# Patient Record
Sex: Male | Born: 1955 | Race: Asian | Hispanic: No | Marital: Married | Smoking: Former smoker
Health system: Southern US, Community
[De-identification: ages and names within clinical notes are randomized; demographics above are authoritative.]

## PROBLEM LIST (undated history)

## (undated) DIAGNOSIS — R200 Anesthesia of skin: Secondary | ICD-10-CM

## (undated) DIAGNOSIS — I7090 Unspecified atherosclerosis: Secondary | ICD-10-CM

## (undated) DIAGNOSIS — M810 Age-related osteoporosis without current pathological fracture: Secondary | ICD-10-CM

## (undated) HISTORY — DX: Unspecified atherosclerosis: I70.90

## (undated) HISTORY — DX: Anesthesia of skin: R20.0

## (undated) HISTORY — DX: Age-related osteoporosis without current pathological fracture: M81.0

---

## 2019-02-20 DIAGNOSIS — I639 Cerebral infarction, unspecified: Secondary | ICD-10-CM

## 2019-02-20 HISTORY — DX: Cerebral infarction, unspecified: I63.9

## 2019-06-04 ENCOUNTER — Ambulatory Visit (INDEPENDENT_AMBULATORY_CARE_PROVIDER_SITE_OTHER): Payer: Self-pay | Admitting: Family Medicine

## 2019-06-04 ENCOUNTER — Encounter: Payer: Self-pay | Admitting: Family Medicine

## 2019-06-04 ENCOUNTER — Other Ambulatory Visit: Payer: Self-pay

## 2019-06-04 VITALS — BP 102/82 | HR 72 | Temp 97.3°F | Ht 63.0 in | Wt 146.0 lb

## 2019-06-04 DIAGNOSIS — Z598 Other problems related to housing and economic circumstances: Secondary | ICD-10-CM

## 2019-06-04 DIAGNOSIS — E663 Overweight: Secondary | ICD-10-CM | POA: Insufficient documentation

## 2019-06-04 DIAGNOSIS — E7800 Pure hypercholesterolemia, unspecified: Secondary | ICD-10-CM

## 2019-06-04 DIAGNOSIS — R200 Anesthesia of skin: Secondary | ICD-10-CM

## 2019-06-04 DIAGNOSIS — Z8673 Personal history of transient ischemic attack (TIA), and cerebral infarction without residual deficits: Secondary | ICD-10-CM

## 2019-06-04 DIAGNOSIS — Z7689 Persons encountering health services in other specified circumstances: Secondary | ICD-10-CM

## 2019-06-04 DIAGNOSIS — Z599 Problem related to housing and economic circumstances, unspecified: Secondary | ICD-10-CM

## 2019-06-04 LAB — CBC WITH DIFFERENTIAL/PLATELET
Basophils Absolute: 0 10*3/uL (ref 0.0–0.1)
Basophils Relative: 0.7 % (ref 0.0–3.0)
Eosinophils Absolute: 0.4 10*3/uL (ref 0.0–0.7)
Eosinophils Relative: 7.1 % — ABNORMAL HIGH (ref 0.0–5.0)
HCT: 44 % (ref 39.0–52.0)
Hemoglobin: 14.6 g/dL (ref 13.0–17.0)
Lymphocytes Relative: 38 % (ref 12.0–46.0)
Lymphs Abs: 2.4 10*3/uL (ref 0.7–4.0)
MCHC: 33.2 g/dL (ref 30.0–36.0)
MCV: 82.8 fl (ref 78.0–100.0)
Monocytes Absolute: 0.7 10*3/uL (ref 0.1–1.0)
Monocytes Relative: 10.7 % (ref 3.0–12.0)
Neutro Abs: 2.7 10*3/uL (ref 1.4–7.7)
Neutrophils Relative %: 43.5 % (ref 43.0–77.0)
Platelets: 301 10*3/uL (ref 150.0–400.0)
RBC: 5.32 Mil/uL (ref 4.22–5.81)
RDW: 13.9 % (ref 11.5–15.5)
WBC: 6.2 10*3/uL (ref 4.0–10.5)

## 2019-06-04 LAB — COMPREHENSIVE METABOLIC PANEL
ALT: 30 U/L (ref 0–53)
AST: 24 U/L (ref 0–37)
Albumin: 4.7 g/dL (ref 3.5–5.2)
Alkaline Phosphatase: 67 U/L (ref 39–117)
BUN: 13 mg/dL (ref 6–23)
CO2: 31 mEq/L (ref 19–32)
Calcium: 9.8 mg/dL (ref 8.4–10.5)
Chloride: 101 mEq/L (ref 96–112)
Creatinine, Ser: 0.75 mg/dL (ref 0.40–1.50)
GFR: 108.6 mL/min (ref >60.00)
Glucose, Bld: 91 mg/dL (ref 70–99)
Potassium: 4.1 mEq/L (ref 3.5–5.1)
Sodium: 137 mEq/L (ref 135–145)
Total Bilirubin: 0.7 mg/dL (ref 0.2–1.2)
Total Protein: 7.8 g/dL (ref 6.0–8.3)

## 2019-06-04 LAB — LIPID PANEL
Cholesterol: 219 mg/dL — ABNORMAL HIGH (ref 0–200)
HDL: 41.5 mg/dL (ref >39.00)
LDL Cholesterol: 152 mg/dL — ABNORMAL HIGH (ref 0–99)
NonHDL: 177.77
Total CHOL/HDL Ratio: 5
Triglycerides: 127 mg/dL (ref 0.0–149.0)
VLDL: 25.4 mg/dL (ref 0.0–40.0)

## 2019-06-04 MED ORDER — ATORVASTATIN CALCIUM 20 MG PO TABS: 20.0000 mg | ORAL_TABLET | Freq: Every day | ORAL | 3 refills | Status: DC

## 2019-06-04 NOTE — Addendum Note (Signed)
Addended by: Olean Ree B on: 06/04/2019 04:48 PM   Modules accepted: Orders

## 2019-06-04 NOTE — Progress Notes (Signed)
Subjective:    Patient ID: George Buckley, male    DOB: 08/04/1965, 64 y.o.   MRN: [id number]  HPI This is a 64 yo male who presents today to establish care. His daughter is with him and helping with translation. He moved from the [place] to the [place] last month along with his wife. He is living with his daughter and SIL. 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   Last CPE- had annual medical care PSA- unknown Colonoscopy- never Tdap- likely had when arrived to [place] Flu- unsure which vaccines he had when he came to the [place] Dental- not regular Exercise-walking on treadmill 30 minutes a day and riding stationary bike.   CVA- 02/20/19, brings CT reports with him today. Continues to have numbness all over (from neck down), worse on right side of body. . Was started on Lyrica, plavix. Does not think it has helped. 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 He had PT in the [place]. Initially needed help with ADLs, now able to do everything except button his shirt. No recent falls. Had high blood pressure prior to stroke. Not on any medication for blood pressure. He was intoxicated and fell, ? Cause of stroke. Was drinking several days a week prior to stroke according to the patient. Per his daughter, he was drinking daily and often getting intoxicated. He has not had any alcohol since the stroke.  CT scan results from [place] [facility] [place] [facility] 02/20/2019. Impression: No acute intracranial hemorrhage or major vascular territory infarct at the time of scan.  Prominent perivascular spaces versus chronic lacunar infarcts, right internal capsule and left lentiform nucleus.  Microvascular white matter ischemic changes.  Cerebral volume loss.  Atherosclerosis.  Probably sinus disease. Chest x-ray showed lungs clear.  Heart size normal in size and shape.  Atherosclerosis of the thoracic aorta and osteoporosis were noted.  Pain right shoulder. Bilateral knee pain since  stroke. No known trauma/ injury/ over use.   Checks blood pressure readings at home. No high readings.  His daughter has been helping him watch his diet very carefully at home and overseeing his exercise.   Review of Systems No visual changes or headaches. No falls. Right arm with decreased ROM and weakness. No leg weakness. No abdominal pain, diarrhea, constipation, blood in stool. No dysuria, no hematuria, no frequency. No cough, fever, SOB or chest pain.     Objective:   Physical Exam Constitutional:      General: He is not in acute distress.    Appearance: Normal appearance. He is normal weight. He is not ill-appearing, toxic-appearing or diaphoretic.  HENT:     Head: Normocephalic and atraumatic.     Right Ear: External ear normal.     Left Ear: External ear normal.     Nose: Nose normal.     Mouth/Throat:     Mouth: Mucous membranes are moist.     Pharynx: Oropharynx is clear.  Eyes:     Extraocular Movements: Extraocular movements intact.     Conjunctiva/sclera: Conjunctivae normal.     Pupils: Pupils are equal, round, and reactive to light.  Neck:     Vascular: No carotid bruit.  Cardiovascular:     Rate and Rhythm: Normal rate and regular rhythm.     Heart sounds: Normal heart sounds.  Pulmonary:     Effort: Pulmonary effort is normal.     Breath sounds: Normal breath sounds.  Musculoskeletal:  Cervical back: Normal range of motion and neck supple. No rigidity or tenderness.     Right lower leg: No edema.     Left lower leg: No edema.     Comments: Ataxic but stable gait. Right foot points out with ambulation. Right hand with mild generalized swelling. No discoloration. Normal temperature.  RUE strength 3/5, all other extremities 5/5.   Lymphadenopathy:     Cervical: No cervical adenopathy.  Skin:    General: Skin is warm and dry.  Neurological:     Mental Status: He is alert.     Cranial Nerves: No cranial nerve deficit.     Sensory: No sensory deficit.      Motor: Weakness present.     Coordination: Coordination normal.     Gait: Gait abnormal.     Deep Tendon Reflexes: Reflexes normal.     Comments: Answers questions appropriately through his daughter.   Psychiatric:        Mood and Affect: Mood normal.        Behavior: Behavior normal.        Thought Content: Thought content normal.        Judgment: Judgment normal.       BP 102/82 (BP Location: Left Arm, Patient Position: Sitting, Cuff Size: Normal)   Pulse 72   Temp (!) 97.3 F (36.3 C)   Ht 5\' 3"  (1.6 m)   Wt 146 lb (66.2 kg)   SpO2 98%   BMI 25.86 kg/m  Depression screen [facility] 2/9 06/04/2019  Decreased Interest 0  Down, Depressed, Hopeless 0  PHQ - 2 Score 0       Assessment & Plan:  1. Encounter to establish care -Patient and his daughter have brought some medical records with him today.  These were reviewed, copies made and will be sent to scan. -Follow-up in 2 months for CPE and to discuss outstanding health maintenance issues  2. History of CVA (cerebrovascular accident) -Per patient and daughter's report, the patient has made tremendous progress.  Need to get him in with neurology locally and referral placed. - Continue Plavix -Continue activities as tolerated - CBC with Differential - Comprehensive metabolic panel - Lipid Panel - Ambulatory referral to Neurology - Ambulatory referral to Connected Care  3. Numbness -Unusual pattern of numbness since stroke but is worse on right side which is his more affected side. - CBC with Differential - Comprehensive metabolic panel - Ambulatory referral to Neurology  4. Overweight - CBC with Differential - Comprehensive metabolic panel - Lipid Panel  5. Financial difficulties -[place] and does not have any health insurance.  We will see if it would be more beneficial for him to be seen at a local community clinic or if there are any resources available. -  Ambulatory referral to Connected Care   This visit occurred during the SARS-CoV-2 public health emergency.  Safety protocols were in place, including screening questions prior to the visit, additional usage of staff PPE, and extensive cleaning of exam room while observing appropriate contact time as indicated for disinfecting solutions.     [place] FNP-BC  [facility], [facility]  06/04/2019 2:59 PM

## 2019-06-04 NOTE — Patient Instructions (Addendum)
Since you are taking clopidogrel (Plavix) do not take ibuprofen, Advil, naprosyn, Alleve, etc. Can take acetaminophen (Tylenol) for pain  I am putting in a referral to neurology and to [facility]- you will get a call   Please follow up in 2 months for complete physical exam  Continue to avoid alcohol

## 2019-06-05 ENCOUNTER — Telehealth: Payer: Self-pay

## 2019-06-05 NOTE — Telephone Encounter (Signed)
06/05/2019 Spoke with patient's daughter Urbano Heir Mr. Chandley has to have an interpreter. Gave information about the [facility].  They see the uninsured on a sliding scale and can refer him to a neurologist they also have someone that can assist the patient with filing for insurance.  Told Mariam what documents the patient would need for his first visit. Olean Ree [phone]

## 2019-06-06 ENCOUNTER — Telehealth: Payer: Self-pay | Admitting: Family Medicine

## 2019-06-06 NOTE — Telephone Encounter (Signed)
Patient's daughter called today She stated that the pharmacy called today in regards to a prescription that is ready for pick up . She stated they were not aware that a prescription was being sent in for the patient and would like a call back to find out what medication was sent for him  Please advise

## 2019-06-06 NOTE — Telephone Encounter (Signed)
Mariam reports pt was in [facility] yesterrday and she said they did not put a referral in for neurology so they still need the referral that PCP put in. Advised this office will f/u to schedule. Mariam verbalized understanding.

## 2019-06-06 NOTE — Telephone Encounter (Signed)
Please see below regarding patient getting care at [facility]. I placed neurology referral when he was in the office this week. Do not want to duplicate, but do need to make sure he gets in to [facility] health center and they are able to refer to neurology. Can you reach out to patient's daughter and see if they have an appointment?

## 2019-06-06 NOTE — Telephone Encounter (Signed)
Explained cholesterol medication to pt's daughter, Judd Lien. She verbalized understanding.

## 2019-06-19 ENCOUNTER — Telehealth: Payer: Self-pay

## 2019-06-19 NOTE — Telephone Encounter (Signed)
06/19/2019 Spoke with patient's daughter Urbano Heir, patient has an appointment to see a neurologist on March 30.  Patient does not have any other needs right now. Olean Ree [phone]

## 2019-06-19 NOTE — Telephone Encounter (Signed)
Thank you so much for your help!

## 2019-07-10 ENCOUNTER — Encounter: Payer: Self-pay | Admitting: *Deleted

## 2019-07-15 ENCOUNTER — Encounter: Payer: Self-pay | Admitting: Diagnostic Neuroimaging

## 2019-07-15 ENCOUNTER — Ambulatory Visit: Payer: Self-pay | Admitting: Diagnostic Neuroimaging

## 2019-07-15 ENCOUNTER — Other Ambulatory Visit: Payer: Self-pay

## 2019-07-15 VITALS — BP 115/79 | HR 77 | Temp 98.4°F | Ht 63.0 in | Wt 147.2 lb

## 2019-07-15 DIAGNOSIS — G959 Disease of spinal cord, unspecified: Secondary | ICD-10-CM

## 2019-07-15 DIAGNOSIS — I63312 Cerebral infarction due to thrombosis of left middle cerebral artery: Secondary | ICD-10-CM

## 2019-07-15 NOTE — Progress Notes (Signed)
[facility]  PATIENT: George Buckley DOB: [birth year]  REFERRING CLINICIAN: Emi Belfast, FNP HISTORY FROM: patient  REASON FOR VISIT: new consult   HISTORICAL  CHIEF COMPLAINT:  Chief Complaint  Patient presents with  . Cerebrovascular Accident    rm 7 New Pt dgtr- George Buckley  "stroke in [facility] Nov 2020, foind blood clot, now on med for that; side effect of stroke is all over numbness, after stroke he was unable to move, PT x 1 month and walking unaided now"    HISTORY OF PRESENT ILLNESS:   64 year old male here for evaluation of stroke.  November 2020 patient was in the [place] developed right-sided weakness and numbness and went to the hospital.  Apparently he was diagnosed with a "stroke".  Family mentions that it was a possible "blood clot".  He was started on to medications.  The day after the stroke he noticed left-sided weakness and numbness.  He was having significant gait and balance difficulties for several weeks following this.  He went through intensive physical therapy.  Now patient has moved to the [place] to live with his daughter.  He and his wife moved back and forth.  Patient continues have numbness from his neck down his entire body bilaterally.  He has gait balance difficulty.  He is taking medications Plavix, statin, blood pressure medicine.   REVIEW OF SYSTEMS: Full 14 system review of systems performed and negative with exception of: As per HPI.  ALLERGIES: No Known Allergies  HOME MEDICATIONS: Outpatient Medications Prior to Visit  Medication Sig Dispense Refill  . atorvastatin (LIPITOR) 20 MG tablet Take 1 tablet (20 mg total) by mouth daily. 90 tablet 3  . B Complex-Biotin-FA (B-COMPLEX PO) Take by mouth.    . candesartan (ATACAND) 8 MG tablet Take 8 mg by mouth daily.    . clopidogrel (PLAVIX) 75 MG tablet Take 75 mg by mouth daily.    . pregabalin (LYRICA) 75 MG capsule Take 75 mg by mouth daily.     No  facility-administered medications prior to visit.    PAST MEDICAL HISTORY: Past Medical History:  Diagnosis Date  . Atherosclerosis    thoracic aorta  . CVA (cerebral vascular accident) (HCC) 02/20/2019  . Numbness   . Osteoporosis     PAST SURGICAL HISTORY: No past surgical history on file.  FAMILY HISTORY: Family History  Problem Relation Age of Onset  . Other Father        old age    SOCIAL HISTORY: Social History   Socioeconomic History  . Marital status: Married    Spouse name: Not on file  . Number of children: 2  . Years of education: Not on file  . Highest education level: Not on file  Occupational History  . Not on file  Tobacco Use  . Smoking status: Former Smoker    Types: Cigarettes  . Smokeless tobacco: Never Used  . Tobacco comment: quit many yrs ago  Substance and Sexual Activity  . Alcohol use: Not Currently    Comment: Drunk when CVA occured 02/20/19  . Drug use: Never  . Sexual activity: Not on file  Other Topics Concern  . Not on file  Social History Narrative   07/15/19 lives with dgtr, George Buckley and his wife   Social Determinants of Health   Financial Resource Strain:   . Difficulty of Paying Living Expenses:   Food Insecurity:   . Worried About Programme researcher, broadcasting/film/video in the Last  Year:   . Ran Out of Food in the Last Year:   Transportation Needs:   . Film/video editor (Medical):   Marland Kitchen Lack of Transportation (Non-Medical):   Physical Activity:   . Days of Exercise per Week:   . Minutes of Exercise per Session:   Stress:   . Feeling of Stress :   Social Connections:   . Frequency of Communication with Friends and Family:   . Frequency of Social Gatherings with Friends and Family:   . Attends Religious Services:   . Active Member of Clubs or Organizations:   . Attends Archivist Meetings:   Marland Kitchen Marital Status:   Intimate Partner Violence:   . Fear of Current or Ex-Partner:   . Emotionally Abused:   Marland Kitchen Physically  Abused:   . Sexually Abused:      PHYSICAL EXAM  GENERAL EXAM/CONSTITUTIONAL: Vitals:  Vitals:   07/15/19 1308  BP: 115/79  Pulse: 77  Temp: 98.4 F (36.9 C)  Weight: 147 lb 3.2 oz (66.8 kg)  Height: 5\' 3"  (1.6 m)     Body mass index is 26.08 kg/m. Wt Readings from Last 3 Encounters:  07/15/19 147 lb 3.2 oz (66.8 kg)  06/04/19 146 lb (66.2 kg)     Patient is in no distress; well developed, nourished and groomed; neck is supple  CARDIOVASCULAR:  Examination of carotid arteries is normal; no carotid bruits  Regular rate and rhythm, no murmurs  Examination of peripheral vascular system by observation and palpation is normal  EYES:  Ophthalmoscopic exam of optic discs and posterior segments is normal; no papilledema or hemorrhages  No exam data present  MUSCULOSKELETAL:  Gait, strength, tone, movements noted in Neurologic exam below  NEUROLOGIC: MENTAL STATUS:  No flowsheet data found.  awake, alert, oriented to person, place and time  recent and remote memory intact  normal attention and concentration  language fluent, comprehension intact, naming intact  fund of knowledge appropriate  CRANIAL NERVE:   2nd - no papilledema on fundoscopic exam  2nd, 3rd, 4th, 6th - pupils equal and reactive to light, visual fields full to confrontation, extraocular muscles intact, no nystagmus  5th - facial sensation symmetric  7th - facial strength symmetric  8th - hearing intact  9th - palate elevates symmetrically, uvula midline  11th - shoulder shrug symmetric  12th - tongue protrusion midline  MOTOR:   INCREASED TONE ON RIGHT SIDE --> RUE 4, RLE 4+; SLOW TAPPING ON RIGHT SIDE  normal bulk, tone strength ON LEFT, EXCEPT DYSTONIC POSTURE OF LEFT HAND  SENSORY:   normal and symmetric to light touch, temperature, vibration  COORDINATION:   finger-nose-finger, fine finger movements --> SLOW ON RIGHT  REFLEXES:   deep tendon reflexes --> BRISK  IN BUE; 1+ IN BLE  GAIT/STATION:   narrow based gait     DIAGNOSTIC DATA (LABS, IMAGING, TESTING) - I reviewed patient records, labs, notes, testing and imaging myself where available.  Lab Results  Component Value Date   WBC 6.2 06/04/2019   HGB 14.6 06/04/2019   HCT 44.0 06/04/2019   MCV 82.8 06/04/2019   PLT 301.0 06/04/2019      Component Value Date/Time   NA 137 06/04/2019 1126   K 4.1 06/04/2019 1126   CL 101 06/04/2019 1126   CO2 31 06/04/2019 1126   GLUCOSE 91 06/04/2019 1126   BUN 13 06/04/2019 1126   CREATININE 0.75 06/04/2019 1126   CALCIUM 9.8 06/04/2019 1126   PROT  7.8 06/04/2019 1126   ALBUMIN 4.7 06/04/2019 1126   AST 24 06/04/2019 1126   ALT 30 06/04/2019 1126   ALKPHOS 67 06/04/2019 1126   BILITOT 0.7 06/04/2019 1126   Lab Results  Component Value Date   CHOL 219 (H) 06/04/2019   HDL 41.50 06/04/2019   LDLCALC 152 (H) 06/04/2019   TRIG 127.0 06/04/2019   CHOLHDL 5 06/04/2019   No results found for: HGBA1C No results found for: VITAMINB12 No results found for: TSH   02/20/19 CT head  - no acute findings - chronic lacunar infarcts vs peri-vascular spaces (right ext capsule; left lentiform nucleus)    ASSESSMENT AND PLAN  64 y.o. year old male here with:  Dx:  1. Cerebrovascular accident (CVA) due to thrombosis of left middle cerebral artery [facility])     PLAN:  STROKE (Nov 2020 --> ? left brain subcortical --> right face, arm leg weakness) - likely small vessel thrombosis; continue plavix, statin, BP control  SPASTIC GAIT (bilateral hyperreflexia; eval for cervical myelopathy) - check MRI cervical spine - use cane / walker; consider PT evaluation  Orders Placed This Encounter  Procedures  . MR CERVICAL SPINE WO CONTRAST   Return pending test results, for pending if symptoms worsen or fail to improve.    Suanne Marker, MD 07/15/2019, 1:34 PM Certified in Neurology, Neurophysiology and Neuroimaging  [facility] [address] [place], [place] [address] [phone]

## 2019-07-15 NOTE — Patient Instructions (Signed)
STROKE - continue plavix, statin, BP control - consider PT (phys therapy) evaluation  BALANCE DIFFICULTY  - check MRI cervical spine (neck)

## 2019-07-17 ENCOUNTER — Telehealth: Payer: Self-pay | Admitting: Diagnostic Neuroimaging

## 2019-07-17 NOTE — Telephone Encounter (Signed)
Self pay Patient is scheduled at [facility] for 07/31/19 arrival time is 10:30 AM. Left a voicemail informing this to the patient I also left their number of [phone] incase he needed to r/s.

## 2019-07-30 ENCOUNTER — Other Ambulatory Visit: Payer: Self-pay

## 2019-07-31 ENCOUNTER — Other Ambulatory Visit: Payer: Self-pay

## 2019-07-31 ENCOUNTER — Ambulatory Visit
Admission: RE | Admit: 2019-07-31 | Discharge: 2019-07-31 | Disposition: A | Discharge status: Home / Self Care | Payer: Self-pay | Source: Ambulatory Visit | Attending: Diagnostic Neuroimaging | Admitting: Diagnostic Neuroimaging

## 2019-07-31 DIAGNOSIS — G959 Disease of spinal cord, unspecified: Secondary | ICD-10-CM | POA: Insufficient documentation

## 2019-08-06 ENCOUNTER — Encounter: Payer: Self-pay | Admitting: Family Medicine

## 2019-08-07 ENCOUNTER — Telehealth: Payer: Self-pay | Admitting: Diagnostic Neuroimaging

## 2019-08-07 NOTE — Telephone Encounter (Signed)
Pt's daughter Marga Hoots. on DPR called wanting to know if the pt's MRI results are in and if she can be called with them. Please advise.

## 2019-08-11 NOTE — Telephone Encounter (Addendum)
Spoke with daughter Judd Lien, on [place] and informed her the MRI cervical spine was an abnormal scan; (possible post traumatic or compressive); also abnormal signal further up could be vascular or autoimmune. Dr Marjory Lies stated he may need further workup (MRI brain and T spine, labs, LP). She had multiple questions which were answered to her verbalized satisfaction. She stated he now has [facility] financial aid but she isn't sure how much or how it works. I gave her # to customer service for FA.  She stated she will call and then discuss further testing with her father. She  verbalized understanding, appreciation.

## 2019-09-05 ENCOUNTER — Other Ambulatory Visit: Payer: Self-pay

## 2019-09-05 ENCOUNTER — Encounter: Payer: Self-pay | Admitting: Family Medicine

## 2019-09-05 ENCOUNTER — Ambulatory Visit (INDEPENDENT_AMBULATORY_CARE_PROVIDER_SITE_OTHER): Payer: Self-pay | Admitting: Family Medicine

## 2019-09-05 VITALS — BP 110/68 | HR 74 | Ht 63.0 in | Wt 148.0 lb

## 2019-09-05 DIAGNOSIS — R200 Anesthesia of skin: Secondary | ICD-10-CM

## 2019-09-05 DIAGNOSIS — E7800 Pure hypercholesterolemia, unspecified: Secondary | ICD-10-CM

## 2019-09-05 DIAGNOSIS — G8929 Other chronic pain: Secondary | ICD-10-CM

## 2019-09-05 DIAGNOSIS — Z8673 Personal history of transient ischemic attack (TIA), and cerebral infarction without residual deficits: Secondary | ICD-10-CM

## 2019-09-05 DIAGNOSIS — I63312 Cerebral infarction due to thrombosis of left middle cerebral artery: Secondary | ICD-10-CM

## 2019-09-05 DIAGNOSIS — M25511 Pain in right shoulder: Secondary | ICD-10-CM

## 2019-09-05 LAB — LIPID PANEL
Cholesterol: 151 mg/dL (ref 0–200)
HDL: 43.1 mg/dL (ref >39.00)
LDL Cholesterol: 71 mg/dL (ref 0–99)
NonHDL: 107.84
Total CHOL/HDL Ratio: 4
Triglycerides: 186 mg/dL — ABNORMAL HIGH (ref 0.0–149.0)
VLDL: 37.2 mg/dL (ref 0.0–40.0)

## 2019-09-05 MED ORDER — LOSARTAN POTASSIUM 50 MG PO TABS: 50.0000 mg | ORAL_TABLET | Freq: Every day | ORAL | 3 refills | Status: AC

## 2019-09-05 MED ORDER — CLOPIDOGREL BISULFATE 75 MG PO TABS: 75.0000 mg | ORAL_TABLET | Freq: Every day | ORAL | 3 refills | Status: AC

## 2019-09-05 NOTE — Progress Notes (Signed)
Subjective:    Patient ID: George Buckley, male    DOB: [birth year], 64 y.o.   MRN: [id number]  HPI Chief Complaint  Patient presents with  . Follow-up    This is a 64 year old male who presents today for follow up. He established care with me approximately 3 months ago. He is accompanied by his daughter who is helping with language barrier/ translation.   He has history of left CVA last year. He saw Dr. Leta Baptist on 07/15/19 who ordered MRI cervical spine, discussed PT and using walker for spastic gait. Other recommendations included statin, continued clopidegrel, lipid management. Patient reports compliance with medication.   MRI cervical spine- this was performed 07/31/19. Per the result notes, daughter was called about results but she is unclear and does not know about recommended follow up. 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 Mild multilevel neural foraminal narrowing. Per Dr. Leta Baptist, patient may need additional work up to rule out vascular or autoimmune causes. Unclear if this is contributing to his persistent right sided numbness.   Right shoulder pain. Chronic, some right arm/ hand swelling.   Exercising twice a day. Using walker around house. Mood good, did not like winter, glad weather is warming up. Helping a little around house. Watches TV. Good appetite. No alcohol.   Review of Systems No headaches, no visual changes, no chest pain, no SOB, no abdominal pain, no diarrhea/ constipation. No dysuria, frequency. No falls.     Objective:   Physical Exam Vitals reviewed.  Constitutional:      General: He is not in acute distress.    Appearance: Normal appearance. He is normal weight. He is not ill-appearing, toxic-appearing or diaphoretic.  HENT:     Head: Normocephalic and atraumatic.  Eyes:     Extraocular Movements: Extraocular  movements intact.     Conjunctiva/sclera: Conjunctivae normal.     Pupils: Pupils are equal, round, and reactive to light.  Cardiovascular:     Rate and Rhythm: Normal rate and regular rhythm.     Heart sounds: Normal heart sounds.  Pulmonary:     Effort: Pulmonary effort is normal.     Breath sounds: Normal breath sounds.  Musculoskeletal:     Cervical back: Normal range of motion and neck supple. Tenderness present. No rigidity.     Right lower leg: No edema.     Left lower leg: No edema.     Comments: UE/ LE full ROM. Right hand curves in at rest. Slight generalized swelling of fingers. Able to get onto exam table with slight assistance. Walking without assistive devices, slightly ataxic gait.   Lymphadenopathy:     Cervical: No cervical adenopathy.  Skin:    General: Skin is warm and dry.  Neurological:     Mental Status: He is alert and oriented to person, place, and time.  Psychiatric:        Mood and Affect: Mood normal.        Behavior: Behavior normal.        Thought Content: Thought content normal.        Judgment: Judgment normal.       BP 110/68   Pulse 74   Ht 5\' 3"  (1.6 m)   Wt 148 lb (67.1 kg)   SpO2 98%   BMI 26.22 kg/m  Wt Readings from Last 3 Encounters:  09/05/19 148 lb (67.1  kg)  07/15/19 147 lb 3.2 oz (66.8 kg)  06/04/19 146 lb (66.2 kg)   Depression screen [facility] 2/9 09/05/2019 06/04/2019  Decreased Interest 0 0  Down, Depressed, Hopeless 0 0  PHQ - 2 Score 0 0  Altered sleeping 0 -  Tired, decreased energy 0 -  Change in appetite 0 -  Feeling bad or failure about yourself  0 -  Trouble concentrating 0 -  Moving slowly or fidgety/restless 0 -  Suicidal thoughts 0 -  PHQ-9 Score 0 -       Assessment & Plan:  1. Cerebrovascular accident (CVA) due to thrombosis of left middle cerebral artery (HCC) - discussed importance of follow up with neurology, patient's daughter will call for an appointment, may benefit from additional PT - encouraged him  to continue daily exercise, activity as tolerated, use walker for gait stabilization.  - Lipid Panel  2. Elevated LDL cholesterol level - Lipid Panel - atorvastatin (LIPITOR) 20 MG tablet; Take 1 tablet (20 mg total) by mouth daily.  Dispense: 90 tablet; Refill: 3  3. Right sided numbness - unclear etiology, ? Related to abnormal findings on MRI  4. Chronic right shoulder pain - reminded to avoid NSAIDs, can take acetaminophen, apply heat, use topical diclofenac gel  5. History of CVA (cerebrovascular accident) - per #1 - atorvastatin (LIPITOR) 20 MG tablet; Take 1 tablet (20 mg total) by mouth daily.  Dispense: 90 tablet; Refill: 3  - follow up in 6 months  This visit occurred during the SARS-CoV-2 public health emergency.  Safety protocols were in place, including screening questions prior to the visit, additional usage of staff PPE, and extensive cleaning of exam room while observing appropriate contact time as indicated for disinfecting solutions.    Olean Ree, FNP-BC  [facility], [place] [facility]  09/06/2019 7:52 AM

## 2019-09-05 NOTE — Patient Instructions (Addendum)
Please call Dr. Richrd Humbles office for follow up of numbness and abnormal MRI. The phone number is [phone]  Please schedule a follow up with me for 6 months

## 2019-09-06 MED ORDER — ATORVASTATIN CALCIUM 20 MG PO TABS: 20.0000 mg | ORAL_TABLET | Freq: Every day | ORAL | 3 refills | Status: AC

## 2019-09-12 ENCOUNTER — Telehealth: Payer: Self-pay

## 2019-09-12 NOTE — Telephone Encounter (Signed)
Mariam pts daughter (DPR signed) said that pt was seen on 09/05/19 and Mariam was advised to contact Dr Marjory Lies neurologist and the # for Dr Marjory Lies was given at the appt. Mariam cannot reach Dr Marjory Lies or get cb from neurology office since 09/11/19 when Mariam first tried to call neurology. Pt has bilateral numbness in both arms and legs. Pt still walking with walker but having difficulty in walking but no more problem walking than when seen on 09/05/19. Pt had CVA 02/2019 but had full recovery. I spoke with Angelica Chessman RN and Allayne Gitelman NP and both said if pt has hx of CVA and has new symptoms of bilateral numbness pt needs to go to ED. Mariam said that was not what she asked about. Mariam is calling us because she cannot reach neurologist and I apologized but I cannot reach neurologist after 4 PM now because the office is closed. The best advice for pt care now is to have pt evaluated at ED. Mariam said OK she will try to reach neurologist next week. Mariam does not think pt needs to go to ED now. FYI to Allayne Gitelman NP and Harlin Heys FNP.

## 2019-09-16 ENCOUNTER — Telehealth: Payer: Self-pay | Admitting: *Deleted

## 2019-09-16 NOTE — Telephone Encounter (Signed)
Pt is scheduled 10/01/19 with Neurology - they called this morning to schedule.  No new symptoms or change in current symptoms.   Nothing further needed.

## 2019-09-16 NOTE — Telephone Encounter (Signed)
Per note received from PCP, Dr Leone Payor called daughter George Buckley on DPR and advised patient needs a follow up to discuss results and next steps. We scheduled for June 15th, advised they arrive early to check in. George Buckley verbalized understanding, appreciation.

## 2019-09-16 NOTE — Telephone Encounter (Signed)
Please call patient's daughter and tell her that I have sent a message to Dr. Marjory Lies and have asked him to have his office contact her regarding a follow up appointment. Please verify that there are no new symptoms or changes. Please tell her to contact us if she has not heard from neurology with in 3 days.

## 2019-09-16 NOTE — Telephone Encounter (Signed)
Noted. As far as I can see he did not seek ED care.

## 2019-09-17 NOTE — Telephone Encounter (Signed)
Noted  

## 2019-09-30 ENCOUNTER — Encounter: Payer: Self-pay | Admitting: Diagnostic Neuroimaging

## 2019-09-30 ENCOUNTER — Other Ambulatory Visit: Payer: Self-pay

## 2019-09-30 ENCOUNTER — Ambulatory Visit (INDEPENDENT_AMBULATORY_CARE_PROVIDER_SITE_OTHER): Payer: Self-pay | Admitting: Diagnostic Neuroimaging

## 2019-09-30 VITALS — BP 108/80 | HR 76 | Ht 63.0 in | Wt 149.6 lb

## 2019-09-30 DIAGNOSIS — R200 Anesthesia of skin: Secondary | ICD-10-CM

## 2019-09-30 DIAGNOSIS — G959 Disease of spinal cord, unspecified: Secondary | ICD-10-CM

## 2019-09-30 NOTE — Patient Instructions (Signed)
WEAKNESS / NUMBNESS / UNSTEADY (likely post-traumatic when he fell in Nov 2020; was intoxicated) - check MRI brain, MRI thoracic spine - then may consider labs and LP - use cane / walker; consider PT evaluation

## 2019-09-30 NOTE — Progress Notes (Signed)
[facility]  PATIENT: George Buckley DOB: 1955-11-13  REFERRING CLINICIAN: Emi Belfast, FNP HISTORY FROM: patient  REASON FOR VISIT: new consult   HISTORICAL  CHIEF COMPLAINT:  Chief Complaint  Patient presents with  . Cerebrovascular Accident    rm 7, dgtr/interpreter- Mariam, FU to review results, next steps    HISTORY OF PRESENT ILLNESS:   UPDATE (09/30/19, VRP): Since last visit, here to review test results. In review of the event in Nov 2020, apparently he had not eaten food that day, was drinking etoh, passed out on a bench at father in law house. Apparently he had fallen off bench and was found by family; he was paralyzed from neck down. After 1 month of intense PT, he was able to take some steps and start moving arms. Now doing better than initial injury; but still has numbness and gait problems.   PRIOR HPI: 64 year old male here for evaluation of stroke.  November 2020 patient was in the [place] developed right-sided weakness and numbness and went to the hospital.  Apparently he was diagnosed with a "stroke".  Family mentions that it was a possible "blood clot".  He was started on to medications.  The day after the stroke he noticed left-sided weakness and numbness.  He was having significant gait and balance difficulties for several weeks following this.  He went through intensive physical therapy.  Now patient has moved to the [place] to live with his daughter.  He and his wife moved back and forth.  Patient continues have numbness from his neck down his entire body bilaterally.  He has gait balance difficulty.  He is taking medications Plavix, statin, blood pressure medicine.   REVIEW OF SYSTEMS: Full 14 system review of systems performed and negative with exception of: As per HPI.  ALLERGIES: No Known Allergies  HOME MEDICATIONS: Outpatient Medications Prior to Visit  Medication Sig Dispense Refill  . atorvastatin (LIPITOR) 20 MG  tablet Take 1 tablet (20 mg total) by mouth daily. 90 tablet 3  . B Complex-Biotin-FA (B-COMPLEX PO) Take by mouth.    . candesartan (ATACAND) 8 MG tablet Take 8 mg by mouth daily.    . clopidogrel (PLAVIX) 75 MG tablet Take 1 tablet (75 mg total) by mouth daily. 90 tablet 3  . losartan (COZAAR) 50 MG tablet Take 1 tablet (50 mg total) by mouth daily. 90 tablet 3  . pregabalin (LYRICA) 75 MG capsule Take 75 mg by mouth daily. (Patient not taking: Reported on 09/30/2019)     No facility-administered medications prior to visit.    PAST MEDICAL HISTORY: Past Medical History:  Diagnosis Date  . Atherosclerosis    thoracic aorta  . CVA (cerebral vascular accident) (HCC) 02/20/2019  . Numbness   . Osteoporosis     PAST SURGICAL HISTORY: No past surgical history on file.  FAMILY HISTORY: Family History  Problem Relation Age of Onset  . Other Father        old age    SOCIAL HISTORY: Social History   Socioeconomic History  . Marital status: Married    Spouse name: Not on file  . Number of children: 2  . Years of education: Not on file  . Highest education level: Not on file  Occupational History  . Not on file  Tobacco Use  . Smoking status: Former Smoker    Types: Cigarettes  . Smokeless tobacco: Never Used  . Tobacco comment: quit many yrs ago  Substance and Sexual Activity  .  Alcohol use: Not Currently    Comment: Drunk when CVA occured 02/20/19  . Drug use: Never  . Sexual activity: Not on file  Other Topics Concern  . Not on file  Social History Narrative   07/15/19 lives with dgtr, [name] and his wife   Social Determinants of Health   Financial Resource Strain:   . Difficulty of Paying Living Expenses:   Food Insecurity:   . Worried About Programme researcher, broadcasting/film/video in the Last Year:   . Barista in the Last Year:   Transportation Needs:   . Freight forwarder (Medical):   Marland Kitchen Lack of Transportation (Non-Medical):   Physical Activity:   . Days  of Exercise per Week:   . Minutes of Exercise per Session:   Stress:   . Feeling of Stress :   Social Connections:   . Frequency of Communication with Friends and Family:   . Frequency of Social Gatherings with Friends and Family:   . Attends Religious Services:   . Active Member of Clubs or Organizations:   . Attends Banker Meetings:   Marland Kitchen Marital Status:   Intimate Partner Violence:   . Fear of Current or Ex-Partner:   . Emotionally Abused:   Marland Kitchen Physically Abused:   . Sexually Abused:      PHYSICAL EXAM  GENERAL EXAM/CONSTITUTIONAL: Vitals:  Vitals:   09/30/19 1001  BP: 108/80  Pulse: 76  Weight: 149 lb 9.6 oz (67.9 kg)  Height: 5\' 3"  (1.6 m)   Body mass index is 26.5 kg/m. Wt Readings from Last 3 Encounters:  09/30/19 149 lb 9.6 oz (67.9 kg)  09/05/19 148 lb (67.1 kg)  07/15/19 147 lb 3.2 oz (66.8 kg)    Patient is in no distress; well developed, nourished and groomed; neck is supple  CARDIOVASCULAR:  Examination of carotid arteries is normal; no carotid bruits  Regular rate and rhythm, no murmurs  Examination of peripheral vascular system by observation and palpation is normal  EYES:  Ophthalmoscopic exam of optic discs and posterior segments is normal; no papilledema or hemorrhages No exam data present  MUSCULOSKELETAL:  Gait, strength, tone, movements noted in Neurologic exam below  NEUROLOGIC: MENTAL STATUS:  No flowsheet data found.  awake, alert, oriented to person, place and time  recent and remote memory intact  normal attention and concentration  language fluent, comprehension intact, naming intact  fund of knowledge appropriate  CRANIAL NERVE:   2nd - no papilledema on fundoscopic exam  2nd, 3rd, 4th, 6th - pupils equal and reactive to light, visual fields full to confrontation, extraocular muscles intact, no nystagmus  5th - facial sensation symmetric  7th - facial strength symmetric  8th - hearing intact  9th  - palate elevates symmetrically, uvula midline  11th - shoulder shrug symmetric  12th - tongue protrusion midline  MOTOR:   INCREASED TONE ON RIGHT SIDE --> RUE 4, RLE 4+; SLOW TAPPING ON RIGHT SIDE  BUE 3-4  BLE 4  SENSORY:   normal and symmetric to light touch, temperature, vibration  COORDINATION:   finger-nose-finger, fine finger movements --> SLOW ON RIGHT  REFLEXES:   deep tendon reflexes --> BRISK IN BUE; 1+ IN BLE  GAIT/STATION:   SPASTIC GAIT; UNSTEADY     DIAGNOSTIC DATA (LABS, IMAGING, TESTING) - I reviewed patient records, labs, notes, testing and imaging myself where available.  Lab Results  Component Value Date   WBC 6.2 06/04/2019   HGB 14.6  06/04/2019   HCT 44.0 06/04/2019   MCV 82.8 06/04/2019   PLT 301.0 06/04/2019      Component Value Date/Time   NA 137 06/04/2019 1126   K 4.1 06/04/2019 1126   CL 101 06/04/2019 1126   CO2 31 06/04/2019 1126   GLUCOSE 91 06/04/2019 1126   BUN 13 06/04/2019 1126   CREATININE 0.75 06/04/2019 1126   CALCIUM 9.8 06/04/2019 1126   PROT 7.8 06/04/2019 1126   ALBUMIN 4.7 06/04/2019 1126   AST 24 06/04/2019 1126   ALT 30 06/04/2019 1126   ALKPHOS 67 06/04/2019 1126   BILITOT 0.7 06/04/2019 1126   Lab Results  Component Value Date   CHOL 151 09/05/2019   HDL 43.10 09/05/2019   LDLCALC 71 09/05/2019   TRIG 186.0 (H) 09/05/2019   CHOLHDL 4 09/05/2019   No results found for: HGBA1C No results found for: VITAMINB12 No results found for: TSH   02/20/19 CT head  - no acute findings - chronic lacunar infarcts vs peri-vascular spaces (right ext capsule; left lentiform nucleus)  07/31/19 MRI cervical spine [I reviewed images myself and agree with interpretation. -VRP]  1. Focal myelomalacia at the C3-4 level a involving predominantly the right lateral and posterior columns. 2. Multilevel degenerative changes of the cervical spine as described above, worst at C3-4 where a posterior disc protrusion  resulting in mild spinal canal stenosis. 3. Mild multilevel neural foraminal narrowing.   ASSESSMENT AND PLAN  64 y.o. year old male here with:  Dx:  1. Numbness   2. 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 MR BRAIN W WO CONTRAST  . MR THORACIC [place]   Return for pending if symptoms worsen or fail to improve.    Penni Bombard, MD 10/10/6387, 37:34 AM Certified in Neurology, Neurophysiology and Neuroimaging  [facility] [address], [place], [place] [address] [phone]

## 2019-10-07 ENCOUNTER — Telehealth: Payer: Self-pay | Admitting: Diagnostic Neuroimaging

## 2019-10-07 NOTE — Telephone Encounter (Signed)
Patient is scheduled at [facility] for 10/24/19 arrival time is 8:30 AM. Patient daughter is aware . I also gave her their number of [phone] incase she needed to r/s.

## 2019-10-22 ENCOUNTER — Ambulatory Visit: Payer: Self-pay

## 2019-10-22 ENCOUNTER — Other Ambulatory Visit: Payer: Self-pay

## 2019-10-22 ENCOUNTER — Ambulatory Visit: Admission: RE | Admit: 2019-10-22 | Payer: Self-pay | Source: Ambulatory Visit

## 2019-10-24 ENCOUNTER — Ambulatory Visit: Payer: Self-pay

## 2019-10-24 ENCOUNTER — Other Ambulatory Visit: Payer: Self-pay

## 2019-10-24 ENCOUNTER — Ambulatory Visit
Admission: RE | Admit: 2019-10-24 | Discharge: 2019-10-24 | Disposition: A | Discharge status: Home / Self Care | Payer: Self-pay | Source: Ambulatory Visit | Attending: Diagnostic Neuroimaging | Admitting: Diagnostic Neuroimaging

## 2019-10-24 DIAGNOSIS — R200 Anesthesia of skin: Secondary | ICD-10-CM

## 2019-10-24 DIAGNOSIS — G959 Disease of spinal cord, unspecified: Secondary | ICD-10-CM | POA: Insufficient documentation

## 2019-10-24 MED ORDER — GADOBUTROL 1 MMOL/ML IV SOLN
7.0000 mL | Freq: Once | INTRAVENOUS | Status: AC | PRN
  Administered 2019-10-24: 7 mL via INTRAVENOUS

## 2019-10-30 ENCOUNTER — Telehealth: Payer: Self-pay | Admitting: *Deleted

## 2019-10-30 NOTE — Telephone Encounter (Signed)
Noted  

## 2019-10-30 NOTE — Telephone Encounter (Signed)
George Buckley called because pt has a family emergency in the [place] and they will be gone 3-6 months and was requesting meds filled. I advised them PCP has filled med in May and gave him 3 month supplies so just check with pharmacy regarding getting refill   FYI to PCP

## 2019-10-30 NOTE — Telephone Encounter (Signed)
Called pac[employer] interpreters, Dagalog language needed. Spoke with Hickman, # [phone]. She called, daughter,  Judd Lien answered. She was with patient at office visit,  is on DPR, speaks and understands [place] well. Mariam asked that I call her back with results without interpreter. I called her, advised MRI brain result is unremarkable. MRI thoracic spine showed a mild abnormality. Dr Marjory Lies will repeat Thoracic spine MRI, order MRI cervical spine in 3 months. Mariam asked that I call her in 2-3 months to discuss how her father is doing. I agreed. She  verbalized understanding, appreciation.

## 2019-12-31 ENCOUNTER — Telehealth: Payer: Self-pay | Admitting: *Deleted

## 2019-12-31 NOTE — Telephone Encounter (Signed)
Called daughter, Judd Lien to discuss repeat MRI cervical, thoracic spine for patient. Unable to LVM , MB full. Of note, a message on 10/30/19 to PCP stated family emergency in [place] will be out of country x 3-6 months.

## 2021-10-22 IMAGING — MR MR HEAD WO/W CM
15 series · 48 of 48 positions shown · IV contrast (gadavist)
Comparison: Cervical study 07/31/2019.

CLINICAL DATA: Spastic gait. Abnormal cervical MRI with
myelomalacia at C3-4.

EXAM:
MRI HEAD WITHOUT AND WITH CONTRAST
TECHNIQUE: Multiplanar, multiecho pulse sequences of the brain and surrounding
structures were obtained without and with intravenous contrast.
CONTRAST:  7mL GADAVIST GADOBUTROL 1 MMOL/ML IV SOLN

[Series 5: ax dwi_tracew · axial · 3.0mm · 0.71mm/px · z∈[-112,+41]mm · 4 of 52 slices shown]
[im 1/52]
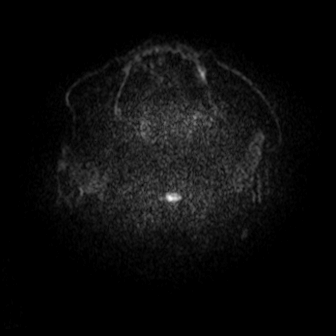
[im 18/52]
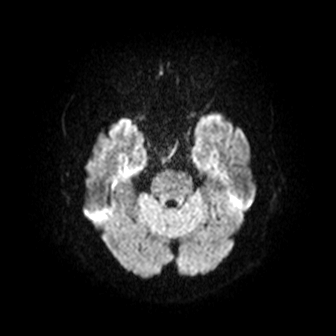
[im 35/52]
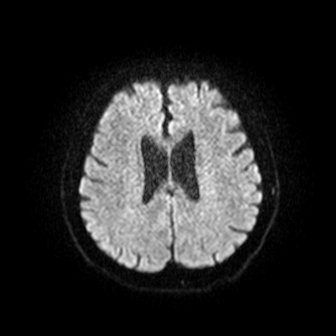
[im 52/52]
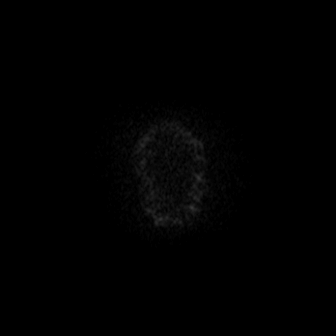

[Series 6: ax dwi_adc · axial · 3.0mm · 0.71mm/px · z∈[-112,+41]mm · 4 of 52 slices shown]
[im 1/52]
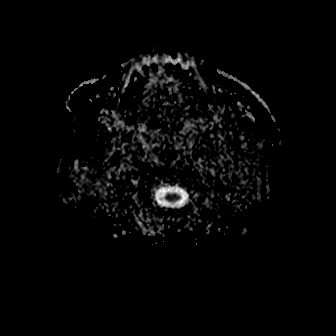
[im 18/52]
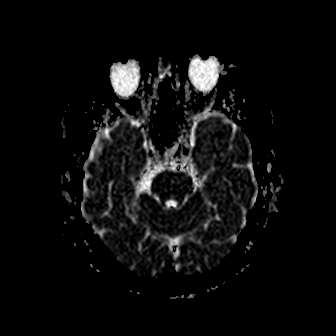
[im 35/52]
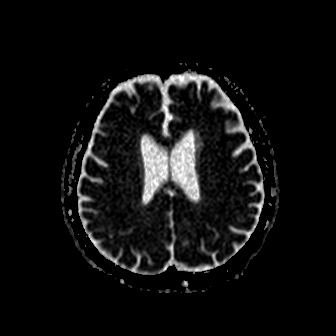
[im 52/52]
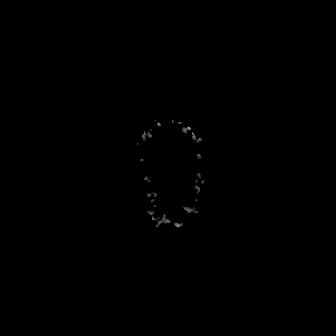

[Series 7: cor dwi_tracew · coronal · 5.0mm · 0.68mm/px · 3 of 34 slices shown]
[im 1/34]
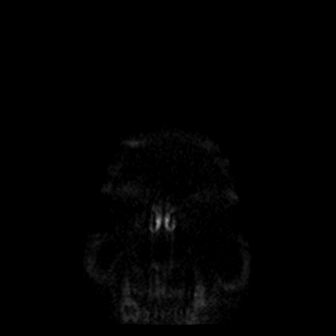
[im 17/34]
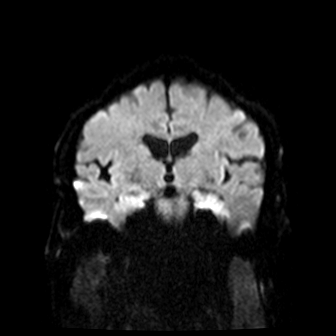
[im 34/34]
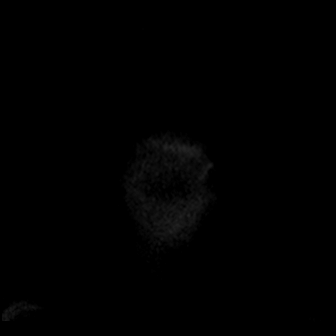

[Series 8: cor dwi_adc · coronal · 5.0mm · 0.68mm/px · 2 of 34 slices shown]
[im 1/34]
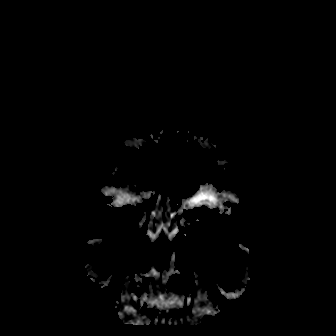
[im 34/34]
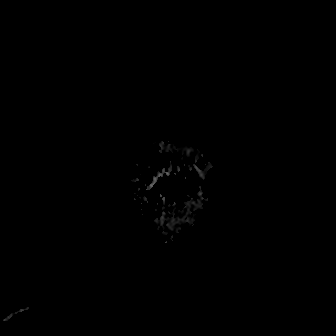

[Series 9: T1 · sagittal · 5.0mm · 0.62mm/px · 1 of 22 slices shown (1 of 2)]
[im 1/22]
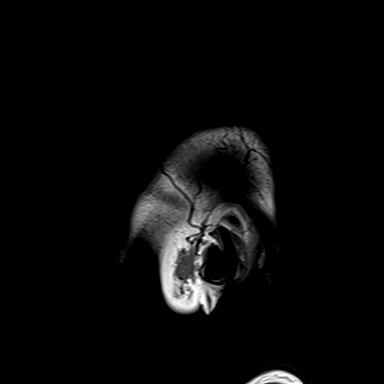

[Series 10: T2 · axial · 5.0mm · 0.53mm/px · 1 of 25 slices shown]
[im 1/25]
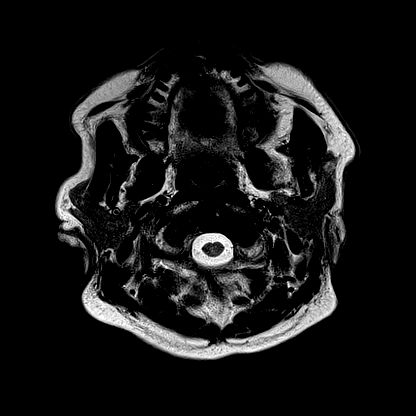

[Series 11: mag_images · axial · 3.0mm · 0.90mm/px · z∈[-124,+53]mm · 3 of 60 slices shown]
[im 1/60]
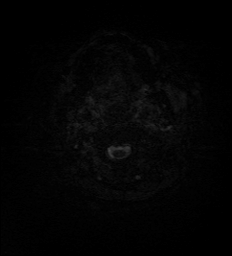
[im 30/60]
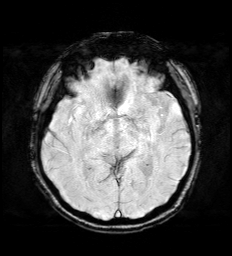
[im 60/60]
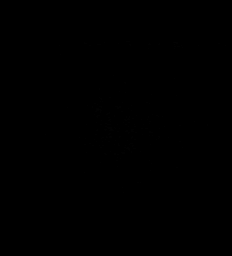

[Series 12: pha_images · axial · 3.0mm · 0.90mm/px · z∈[-124,+53]mm · 3 of 60 slices shown]
[im 1/60]
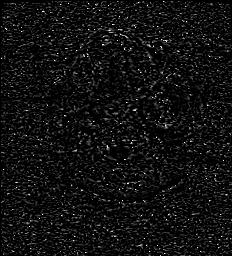
[im 30/60]
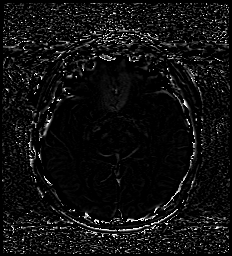
[im 60/60]
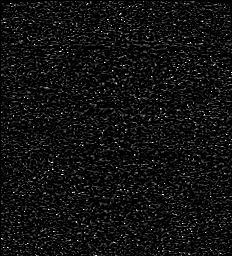

[Series 13: swi_images · axial · 3.0mm · 0.90mm/px · z∈[-124,+53]mm · 3 of 60 slices shown]
[im 1/60]
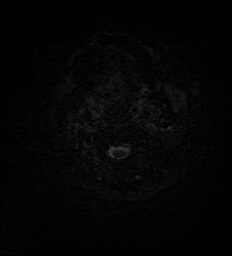
[im 30/60]
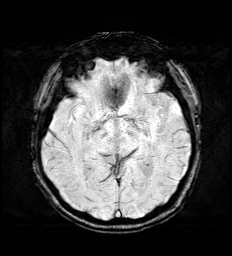
[im 60/60]
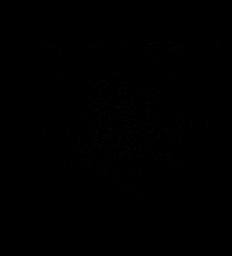

[Series 15: FLAIR · axial · 3.0mm · 0.53mm/px · z∈[-117,+45]mm · 3 of 55 slices shown]
[im 1/55]
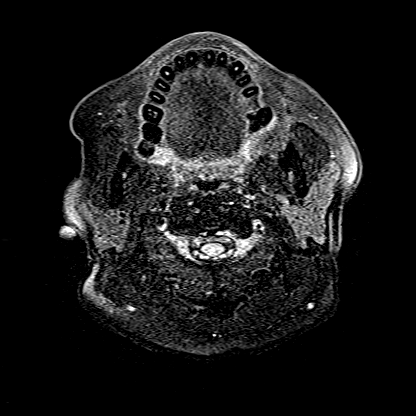
[im 28/55]
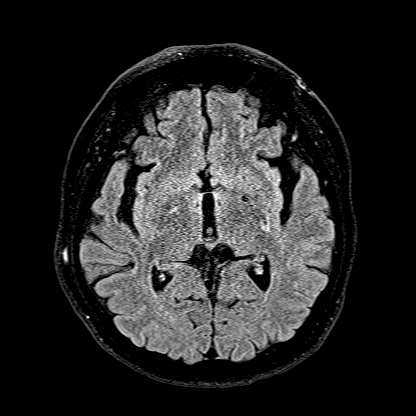
[im 55/55]
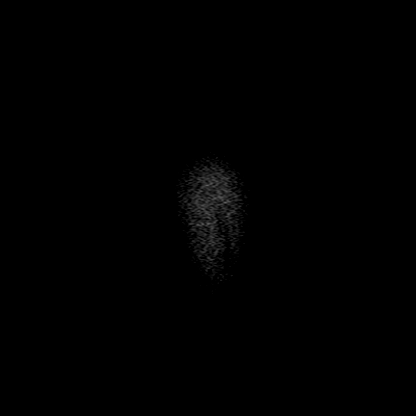

[Series 16: T1 · axial · 1.0mm · 0.98mm/px · z∈[-115,+44]mm · 9 of 160 slices shown (2 of 2)]
[im 1/160]
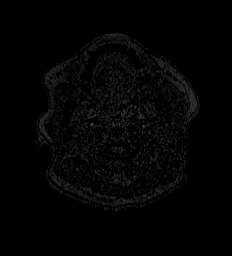
[im 20/160]
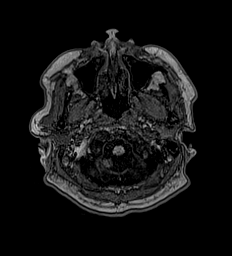
[im 40/160]
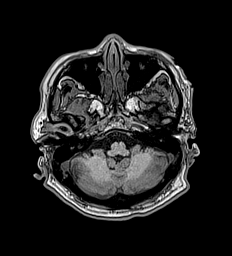
[im 60/160]
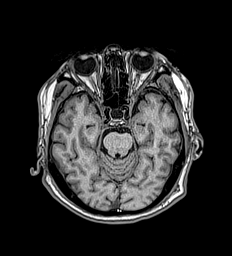
[im 80/160]
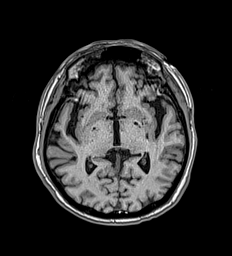
[im 100/160]
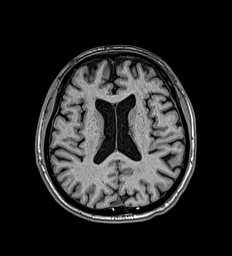
[im 120/160]
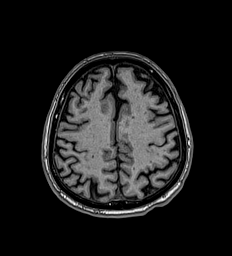
[im 140/160]
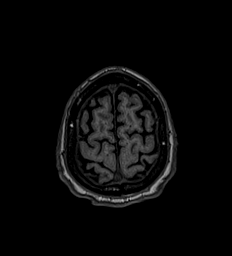
[im 160/160]
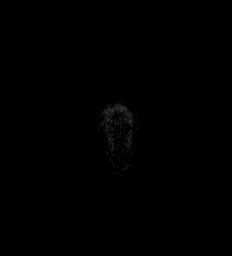

[Series 17: T2 post-contrast · coronal · 5.0mm · 0.57mm/px · 1 of 26 slices shown]
[im 1/26]
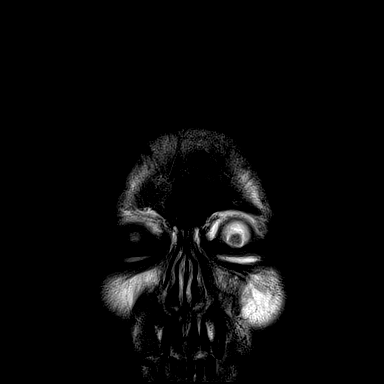

[Series 18: T1 post-contrast · axial · 1.0mm · 0.98mm/px · z∈[-115,+44]mm · 9 of 160 slices shown (1 of 3)]
[im 1/160]
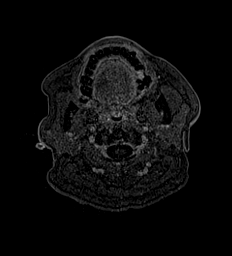
[im 20/160]
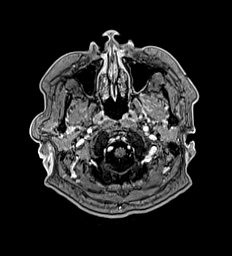
[im 40/160]
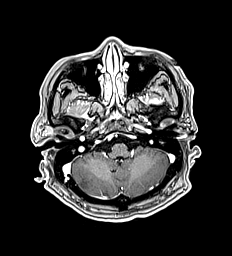
[im 60/160]
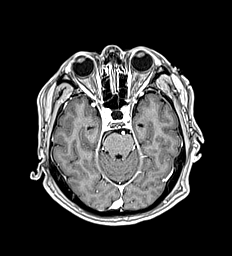
[im 80/160]
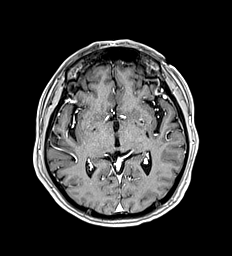
[im 100/160]
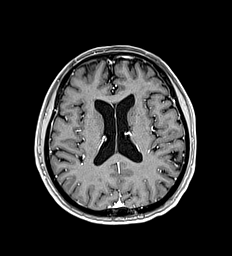
[im 120/160]
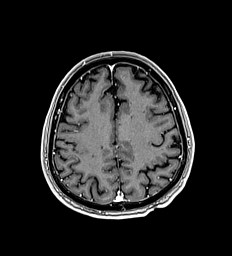
[im 140/160]
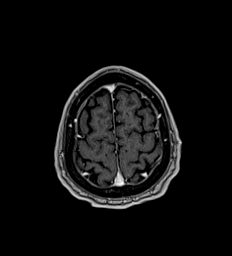
[im 160/160]
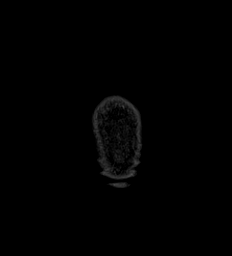

[Series 19: T1 post-contrast · coronal · 5.0mm · 0.57mm/px · 1 of 26 slices shown (2 of 3)]
[im 1/26]
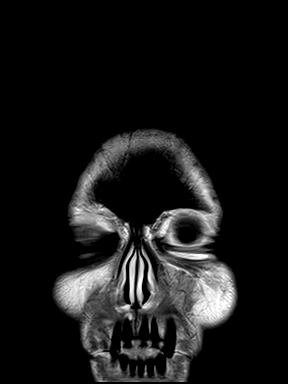

[Series 20: T1 post-contrast · sagittal · 5.0mm · 0.62mm/px · 1 of 22 slices shown (3 of 3)]
[im 1/22]
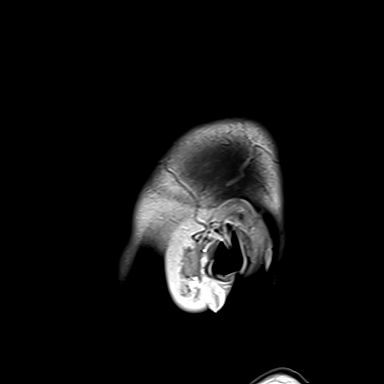

[48 of 48 positions shown; findings below may reference images not displayed]

FINDINGS: Brain: Diffusion imaging does not show any acute or subacute
infarction. The brainstem and cerebellum are normal. Cerebral
hemispheres do not show advanced atrophy. There are a few scattered
small foci of T2 and FLAIR signal within the white matter most
consistent with small vessel change. No finding specific for
demyelinating disease. No cortical or large vessel territory
infarction. No mass lesion, hemorrhage, hydrocephalus or extra-axial
collection. After contrast administration, no abnormal enhancement
occurs.

Vascular: Major vessels at the base of the brain show flow.

Skull and upper cervical spine: Negative

Sinuses/Orbits: Clear/normal

Other: None
IMPRESSION: No sign of demyelinating disease. Few small foci of T2 and FLAIR
signal within the cerebral hemispheric white matter consistent with
early/minimal small vessel change. No acute or reversible finding.
